# Patient Record
Sex: Female | Born: 1995 | Race: Black or African American | Hispanic: No | Marital: Single | Smoking: Never smoker
Health system: Southern US, Community
[De-identification: ages and names within clinical notes are randomized; demographics above are authoritative.]

## PROBLEM LIST (undated history)

## (undated) HISTORY — PX: TYMPANOSTOMY TUBE PLACEMENT: SHX32

---
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
## 2016-10-08 ENCOUNTER — Encounter (HOSPITAL_COMMUNITY): Payer: Self-pay | Admitting: Emergency Medicine

## 2016-10-08 ENCOUNTER — Ambulatory Visit (HOSPITAL_COMMUNITY)
Admission: EM | Admit: 2016-10-08 | Discharge: 2016-10-08 | Disposition: A | Discharge status: Home / Self Care | Payer: BLUE CROSS/BLUE SHIELD | Attending: Emergency Medicine | Admitting: Emergency Medicine

## 2016-10-08 DIAGNOSIS — R0789 Other chest pain: Secondary | ICD-10-CM

## 2016-10-08 DIAGNOSIS — M940 Chondrocostal junction syndrome [Tietze]: Secondary | ICD-10-CM

## 2016-10-08 MED ORDER — NAPROXEN 375 MG PO TABS: 375.0000 mg | ORAL_TABLET | Freq: Two times a day (BID) | ORAL | 0 refills | Status: DC

## 2016-10-08 NOTE — Discharge Instructions (Signed)
Apply ice to the chest wall wherever you have pain. Do this to 3 times a day for the next few days. Also take the prescribed anti-inflammatory medicine as directed. Take with food. Realized that this type of pain often lasts for several days to several weeks. No indication of heart or lung problems.

## 2016-10-08 NOTE — ED Provider Notes (Signed)
CSN: [id number]     Arrival date & time 10/08/16  1201 History   First MD Initiated Contact with Patient 10/08/16 1359     Chief Complaint  Patient presents with  . Chest Pain  . Cough   (Consider location/radiation/quality/duration/timing/severity/associated sxs/prior Treatment) 21 year old female complaining of pain in the chest that hurts when she takes a deep breath and touches her ribs. Started suddenly around 1 AM this morning. Denies shortness of breath or cough or other symptoms.      History reviewed. No pertinent past medical history. Past Surgical History:  Procedure Laterality Date  . TYMPANOSTOMY TUBE PLACEMENT     History reviewed. No pertinent family history. Social History  Substance Use Topics  . Smoking status: Never Smoker  . Smokeless tobacco: Not on file  . Alcohol use No   OB History    No data available     Review of Systems  Constitutional: Negative.   Respiratory: Negative.  Negative for shortness of breath and wheezing.   Cardiovascular: Positive for chest pain. Negative for palpitations and leg swelling.  Gastrointestinal: Negative.   Skin: Negative.   Neurological: Negative.   All other systems reviewed and are negative.   Allergies  Patient has no known allergies.  Home Medications   Prior to Admission medications   Medication Sig Start Date End Date Taking? Authorizing Provider  naproxen (NAPROSYN) 375 MG tablet Take 1 tablet (375 mg total) by mouth 2 (two) times daily. 10/08/16   Janne Napoleon, NP   Meds Ordered and Administered this Visit  Medications - No data to display  BP 105/71 (BP Location: Right Arm)   Pulse 67   Temp 98.5 F (36.9 C) (Oral)   Resp 18   LMP 10/01/2016   SpO2 100%  No data found.   Physical Exam  Constitutional: She is oriented to person, place, and time. She appears well-developed and well-nourished.  Neck: Neck supple.  Cardiovascular: Normal rate, regular rhythm, normal heart sounds and intact  distal pulses.   Pulmonary/Chest: Effort normal and breath sounds normal. No respiratory distress. She has no wheezes. She has no rales. She exhibits tenderness.  Marked, reproducible chest wall tenderness to the right upper chest and along the parasternal borders and bilateral costal margins. Having patient take a deep breath causes her to stop suddenly toward the end of the breath because of pain in the areas described above.  Musculoskeletal: Normal range of motion. She exhibits no edema or deformity.  Neurological: She is alert and oriented to person, place, and time.  Skin: Skin is warm and dry.  Psychiatric: She has a normal mood and affect.  Nursing note and vitals reviewed.   Urgent Care Course     Procedures (including critical care time)  Labs Review Labs Reviewed - No data to display  Imaging Review No results found.   Visual Acuity Review  Right Eye Distance:   Left Eye Distance:   Bilateral Distance:    Right Eye Near:   Left Eye Near:    Bilateral Near:         MDM   1. Chest wall pain   2. Costochondritis, acute    Apply ice to the chest wall wherever you have pain. Do this to 3 times a day for the next few days. Also take the prescribed anti-inflammatory medicine as directed. Take with food. Realized that this type of pain often lasts for several days to several weeks. No indication of heart or lung  problems. Meds ordered this encounter  Medications  . naproxen (NAPROSYN) 375 MG tablet    Sig: Take 1 tablet (375 mg total) by mouth 2 (two) times daily.    Dispense:  20 tablet    Refill:  0    Order Specific Question:   Supervising Provider    Answer:   Melynda Ripple [[id number]]       Janne Napoleon, NP 10/08/16 1419

## 2016-10-08 NOTE — ED Triage Notes (Signed)
See s/s

## 2017-12-11 ENCOUNTER — Other Ambulatory Visit: Payer: Self-pay | Admitting: Family Medicine

## 2017-12-11 ENCOUNTER — Ambulatory Visit
Admission: RE | Admit: 2017-12-11 | Discharge: 2017-12-11 | Disposition: A | Discharge status: Home / Self Care | Source: Ambulatory Visit | Attending: Family Medicine | Admitting: Family Medicine

## 2017-12-11 DIAGNOSIS — M545 Low back pain: Secondary | ICD-10-CM

## 2018-01-01 ENCOUNTER — Other Ambulatory Visit: Payer: Self-pay | Admitting: Family Medicine

## 2018-01-01 ENCOUNTER — Other Ambulatory Visit

## 2018-01-01 ENCOUNTER — Ambulatory Visit
Admission: RE | Admit: 2018-01-01 | Discharge: 2018-01-01 | Disposition: A | Discharge status: Home / Self Care | Source: Ambulatory Visit | Attending: Family Medicine | Admitting: Family Medicine

## 2018-01-01 DIAGNOSIS — M545 Low back pain: Secondary | ICD-10-CM

## 2018-01-01 DIAGNOSIS — R52 Pain, unspecified: Secondary | ICD-10-CM

## 2019-04-20 ENCOUNTER — Other Ambulatory Visit: Payer: Self-pay

## 2019-04-20 DIAGNOSIS — Z20822 Contact with and (suspected) exposure to covid-19: Secondary | ICD-10-CM

## 2019-04-22 LAB — NOVEL CORONAVIRUS, NAA: SARS-CoV-2, NAA: NOT DETECTED

## 2020-01-11 ENCOUNTER — Other Ambulatory Visit: Payer: Self-pay | Admitting: Internal Medicine

## 2020-01-11 DIAGNOSIS — N949 Unspecified condition associated with female genital organs and menstrual cycle: Secondary | ICD-10-CM

## 2020-01-20 ENCOUNTER — Ambulatory Visit
Admission: RE | Admit: 2020-01-20 | Discharge: 2020-01-20 | Disposition: A | Discharge status: Home / Self Care | Payer: No Typology Code available for payment source | Source: Ambulatory Visit | Attending: Internal Medicine | Admitting: Internal Medicine

## 2020-01-20 ENCOUNTER — Other Ambulatory Visit: Payer: Self-pay

## 2020-01-20 DIAGNOSIS — N949 Unspecified condition associated with female genital organs and menstrual cycle: Secondary | ICD-10-CM | POA: Diagnosis not present

## 2020-01-26 ENCOUNTER — Ambulatory Visit (INDEPENDENT_AMBULATORY_CARE_PROVIDER_SITE_OTHER): Payer: No Typology Code available for payment source | Admitting: Podiatry

## 2020-01-26 ENCOUNTER — Ambulatory Visit (INDEPENDENT_AMBULATORY_CARE_PROVIDER_SITE_OTHER): Payer: No Typology Code available for payment source

## 2020-01-26 ENCOUNTER — Encounter: Payer: Self-pay | Admitting: Podiatry

## 2020-01-26 ENCOUNTER — Other Ambulatory Visit: Payer: Self-pay

## 2020-01-26 DIAGNOSIS — M2141 Flat foot [pes planus] (acquired), right foot: Secondary | ICD-10-CM

## 2020-01-26 DIAGNOSIS — M2142 Flat foot [pes planus] (acquired), left foot: Secondary | ICD-10-CM | POA: Diagnosis not present

## 2020-01-26 NOTE — Progress Notes (Signed)
   Subjective:  24 y.o. female presenting today as a new patient who recently got out of the [employer] after serving for 6 years presenting today for evaluation of bilateral foot pain.  Patient has constant ache and pain with activity.  She is very limited to her activities lately.  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  She has been dealing with severe foot pain over the past 1-2 years.  She notices cracking and popping with walking and bending over.  She has been soaking her feet in doing foot exercises with foot massage with minimal relief.  She denies history of injury   No past medical history on file.     Objective/Physical Exam General: The patient is alert and oriented x3 in no acute distress.  Dermatology: Skin is warm, dry and supple bilateral lower extremities. Negative for open lesions or macerations.  Vascular: Palpable pedal pulses bilaterally. No edema or erythema noted. Capillary refill within normal limits.  Neurological: Epicritic and protective threshold grossly intact bilaterally.   Musculoskeletal Exam: Range of motion within normal limits to all pedal and ankle joints bilateral. Muscle strength 5/5 in all groups bilateral.  Upon weightbearing there is a medial longitudinal arch collapse bilaterally. Remove foot valgus noted to the bilateral lower extremities with excessive pronation upon mid stance.  Radiographic Exam:  Normal osseous mineralization. Joint spaces preserved. No fracture/dislocation/boney destruction.   Pes planus noted on radiographic exam lateral views. Decreased calcaneal inclination and metatarsal declination angle is noted. Anterior break in the cyma line noted on lateral views. Medial talar head to deviation noted on AP radiograph.   Assessment: 1. pes planus bilateral   Plan of Care:  1. Patient was evaluated. X-Rays reviewed.  2.  Prescription provided today for custom  molded orthotics to take to the [place] for approval 3.  Recommend good supportive shoes at [facility] in [place] 4.  Recommend daily foot exercises to strengthen the intrinsic musculature of the foot 5.  Return to clinic as needed   Edrick Kins, DPM [facility]  Dr. Edrick Kins, [address]                                        [place], [place] [address]                Office [phone]  Fax [phone]

## 2020-02-08 ENCOUNTER — Telehealth: Payer: Self-pay | Admitting: Obstetrics & Gynecology

## 2020-02-08 NOTE — Telephone Encounter (Signed)
[facility], contraception has nexplanon. Paper records. Called and left voicemail for patient to call back to be scheduled.

## 2020-02-10 NOTE — Telephone Encounter (Signed)
Called and left voicemail for patient to call back to be scheduled. 

## 2020-02-17 NOTE — Patient Instructions (Signed)
I value your feedback and entrusting us with your care. If you get a [facility] patient survey, I would appreciate you taking the time to let us know about your experience today. Thank you!  As of July 09, 2019, your lab results will be released to your MyChart immediately, before I even have a chance to see them. Please give me time to review them and contact you if there are any abnormalities. Thank you for your patience.  

## 2020-02-17 NOTE — Progress Notes (Signed)
Brittney Ping, MD   Chief Complaint  Patient presents with  . Pelvic Pain    entire pelvic area, dark blood (cycles), weight gain, headaches    HPI:      [name] is a 24 y.o. No obstetric history on file. whose LMP was Patient's last menstrual period was 01/28/2020 (approximate)., presents today for NP eval of BTB with nexplanon, referred by PCP. Nexplanon placed ~ summer 2019, but pt unsure of date. This was 2nd nexplanon and pt has noted hormonal acne and wt changes with this device. Menses are usually monthly, last 3-5 days, no BTB, mild dysmen, improved with NSAIDs. Has had dark BTB for a few days the past few cycles. Noted to have 2.5 cm simple RTO cyst on 6/21 U/S, 2.8 cm leio. Has questions about them. She is sex active, no pain/bleeding. No recent STD testing. Is current on pap smear last yr. No hx of abn paps/STDs.  No hx of HTN, DVTs, migraines with aura. Did OCPs in past.    History reviewed. No pertinent past medical history.  Past Surgical History:  Procedure Laterality Date  . TYMPANOSTOMY TUBE PLACEMENT      Family History  Problem Relation Age of Onset  . Other Mother        low iron, low blood pressure    Social History   Socioeconomic History  . Marital status: Single    Spouse name: Not on file  . Number of children: Not on file  . Years of education: Not on file  . Highest education level: Not on file  Occupational History  . Not on file  Tobacco Use  . Smoking status: Never Smoker  . Smokeless tobacco: Never Used  Vaping Use  . Vaping Use: Never used  Substance and Sexual Activity  . Alcohol use: No  . Drug use: No  . Sexual activity: Yes    Birth control/protection: None, Implant  Other Topics Concern  . Not on file  Social History Narrative  . Not on file   Social Determinants of Health   Financial Resource Strain:   . Difficulty of Paying Living Expenses:   Food Insecurity:   . Worried About Charity fundraiser  in the Last Year:   . Arboriculturist in the Last Year:   Transportation Needs:   . Film/video editor (Medical):   Marland Kitchen Lack of Transportation (Non-Medical):   Physical Activity:   . Days of Exercise per Week:   . Minutes of Exercise per Session:   Stress:   . Feeling of Stress :   Social Connections:   . Frequency of Communication with Friends and Family:   . Frequency of Social Gatherings with Friends and Family:   . Attends Religious Services:   . Active Member of Clubs or Organizations:   . Attends Archivist Meetings:   Marland Kitchen Marital Status:   Intimate Partner Violence:   . Fear of Current or Ex-Partner:   . Emotionally Abused:   Marland Kitchen Physically Abused:   . Sexually Abused:     Outpatient Medications Prior to Visit  Medication Sig Dispense Refill  . etonogestrel (NEXPLANON) 68 MG IMPL implant 1 each by Subdermal route once. Unsure of insertion date, thinks 2019.    . naproxen (NAPROSYN) 375 MG tablet Take 1 tablet (375 mg total) by mouth 2 (two) times daily. 20 tablet 0   No facility-administered medications prior to visit.  ROS:  Review of Systems  Constitutional: Negative for fever.  Gastrointestinal: Negative for blood in stool, constipation, diarrhea, nausea and vomiting.  Genitourinary: Positive for vaginal bleeding. Negative for dyspareunia, dysuria, flank pain, frequency, hematuria, urgency, vaginal discharge and vaginal pain.  Musculoskeletal: Negative for back pain.  Skin: Negative for rash.   BREAST: No symptoms   OBJECTIVE:   Vitals:  BP 100/70   Ht 5\' 1"  (1.549 m)   Wt 177 lb (80.3 kg)   LMP 01/28/2020 (Approximate)   BMI 33.44 kg/m   Physical Exam Vitals reviewed.  Constitutional:      Appearance: She is well-developed.  Pulmonary:     Effort: Pulmonary effort is normal.  Genitourinary:    General: Normal vulva.     Pubic Area: No rash.      Labia:        Right: No rash, tenderness or lesion.        Left: No rash,  tenderness or lesion.      Vagina: Bleeding present. No vaginal discharge, erythema or tenderness.     Cervix: Normal.     Uterus: Normal. Not enlarged and not tender.      Adnexa: Right adnexa normal and left adnexa normal.       Right: No mass or tenderness.         Left: No mass or tenderness.    Musculoskeletal:        General: Normal range of motion.     Cervical back: Normal range of motion.  Skin:    General: Skin is warm and dry.  Neurological:     General: No focal deficit present.     Mental Status: She is alert and oriented to person, place, and time.  Psychiatric:        Mood and Affect: Mood normal.        Behavior: Behavior normal.        Thought Content: Thought content normal.        Judgment: Judgment normal.     Assessment/Plan: Breakthrough bleeding on Nexplanon--Reassurance. Normal side effect, especially considering neg GYN u/s 6/21. Check STDs. If neg, can follow along. Can add POPs prn for bleeding if sx worsen. Also discussed nexplanon removal and changing to different BC. Pt doesn't want IUD, can't have xulane due to BMI. Pt to consider and f/u prn.   Screening for STD (sexually transmitted disease) - Plan: Cervicovaginal ancillary only  Right ovarian cyst--small, simple cyst. No further imaging needed. Reassurance. F/u prn.   Leiomyoma--small leio. Questions answered.     Return if symptoms worsen or fail to improve.  Brittney Trevino B. Brittney Bransfield, PA-C 02/18/2020 10:36 AM

## 2020-02-18 ENCOUNTER — Ambulatory Visit (INDEPENDENT_AMBULATORY_CARE_PROVIDER_SITE_OTHER): Payer: No Typology Code available for payment source | Admitting: Obstetrics and Gynecology

## 2020-02-18 ENCOUNTER — Encounter: Payer: Self-pay | Admitting: Obstetrics and Gynecology

## 2020-02-18 ENCOUNTER — Other Ambulatory Visit: Payer: Self-pay

## 2020-02-18 ENCOUNTER — Other Ambulatory Visit (HOSPITAL_COMMUNITY)
Admission: RE | Admit: 2020-02-18 | Discharge: 2020-02-18 | Disposition: A | Discharge status: Home / Self Care | Payer: No Typology Code available for payment source | Source: Ambulatory Visit | Attending: Obstetrics and Gynecology | Admitting: Obstetrics and Gynecology

## 2020-02-18 VITALS — BP 100/70 | Ht 61.0 in | Wt 177.0 lb

## 2020-02-18 DIAGNOSIS — Z975 Presence of (intrauterine) contraceptive device: Secondary | ICD-10-CM

## 2020-02-18 DIAGNOSIS — Z113 Encounter for screening for infections with a predominantly sexual mode of transmission: Secondary | ICD-10-CM

## 2020-02-18 DIAGNOSIS — D219 Benign neoplasm of connective and other soft tissue, unspecified: Secondary | ICD-10-CM | POA: Diagnosis not present

## 2020-02-18 DIAGNOSIS — N83201 Unspecified ovarian cyst, right side: Secondary | ICD-10-CM | POA: Insufficient documentation

## 2020-02-18 DIAGNOSIS — N921 Excessive and frequent menstruation with irregular cycle: Secondary | ICD-10-CM

## 2020-02-19 LAB — CERVICOVAGINAL ANCILLARY ONLY
Chlamydia: NEGATIVE
Comment: NEGATIVE
Comment: NORMAL
Neisseria Gonorrhea: NEGATIVE

## 2020-11-14 IMAGING — US US PELVIS COMPLETE WITH TRANSVAGINAL
1 series · 14 of 25 positions shown · non-contrast
Comparison: None

CLINICAL DATA: Pelvic pressure adnexal cyst

EXAM:
TRANSABDOMINAL AND TRANSVAGINAL ULTRASOUND OF PELVIS
TECHNIQUE: Both transabdominal and transvaginal ultrasound examinations of the
pelvis were performed. Transabdominal technique was performed for
global imaging of the pelvis including uterus, ovaries, adnexal
regions, and pelvic cul-de-sac. It was necessary to proceed with
endovaginal exam following the transabdominal exam to visualize the
uterus endometrium ovaries.

[Series 1: us pelvis complete with transvaginal · 0.25mm/px · 108 acquisitions, 14 frames shown]
[im 1/108]
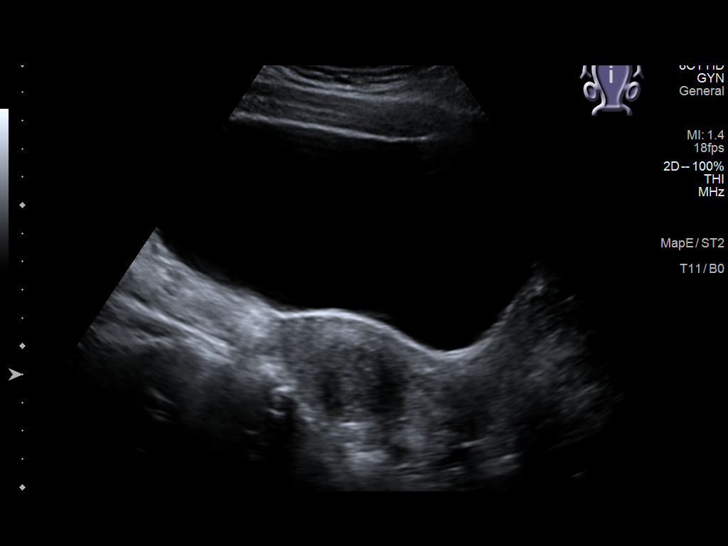
[im 9/108]
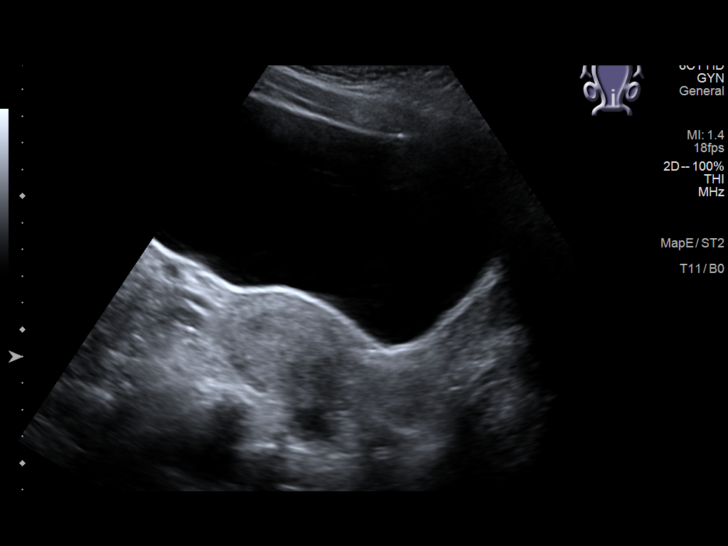
[im 18/108]
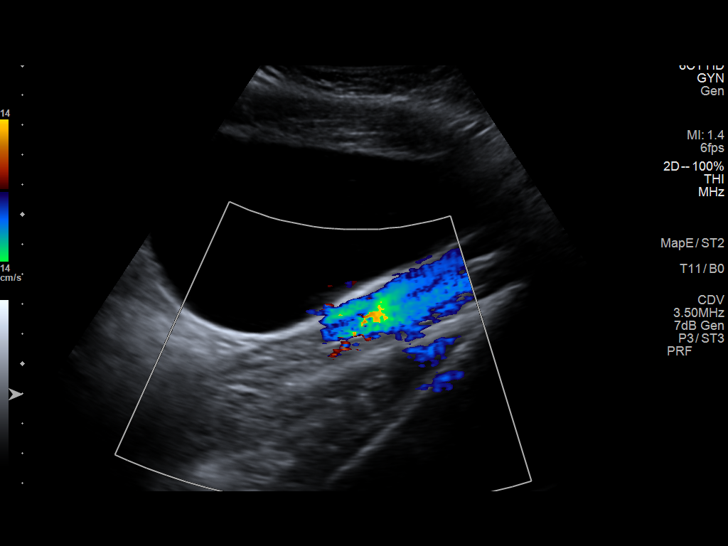
[im 27/108]
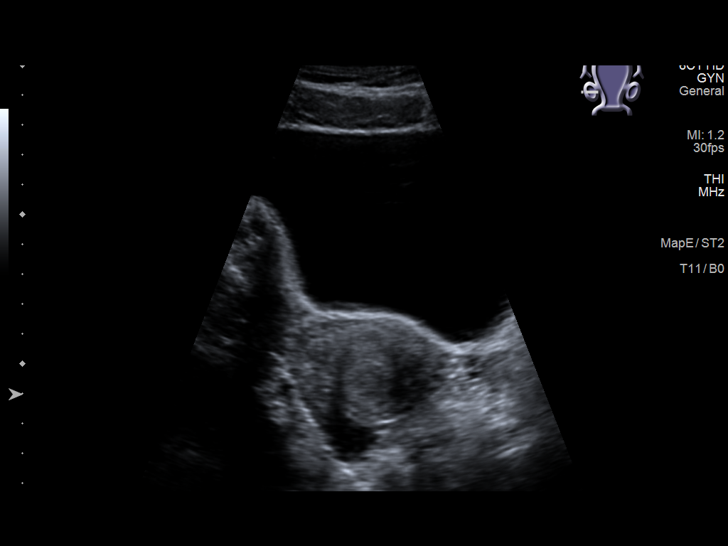
[im 36/108]
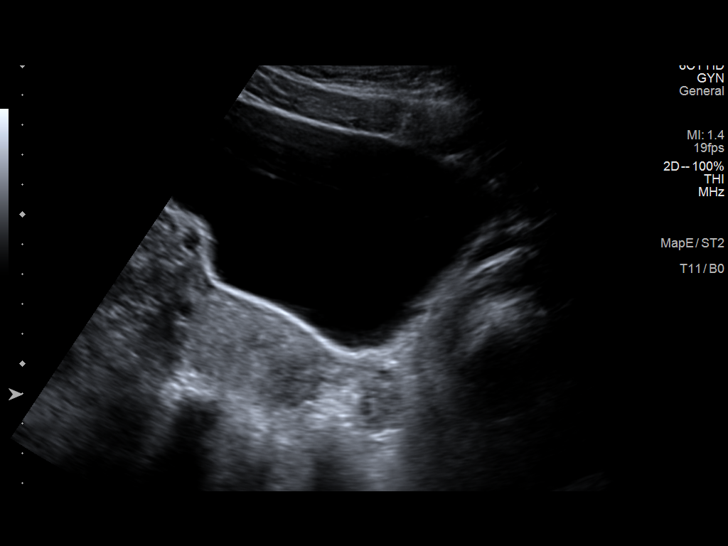
[im 41/108]
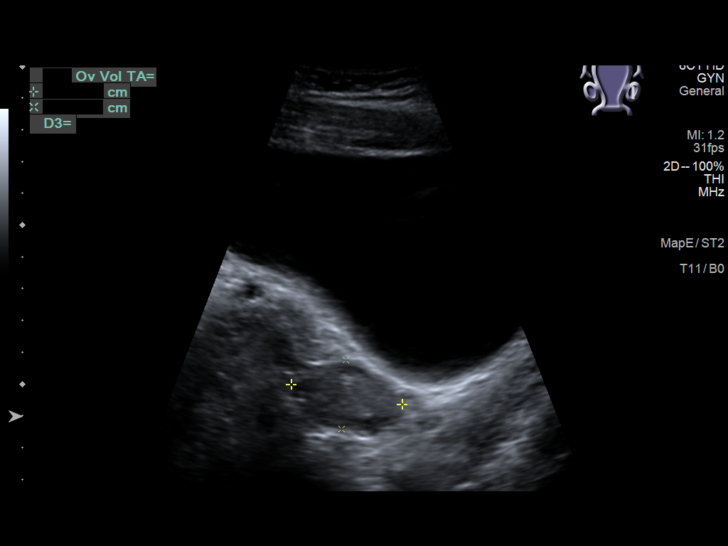
[im 50/108]
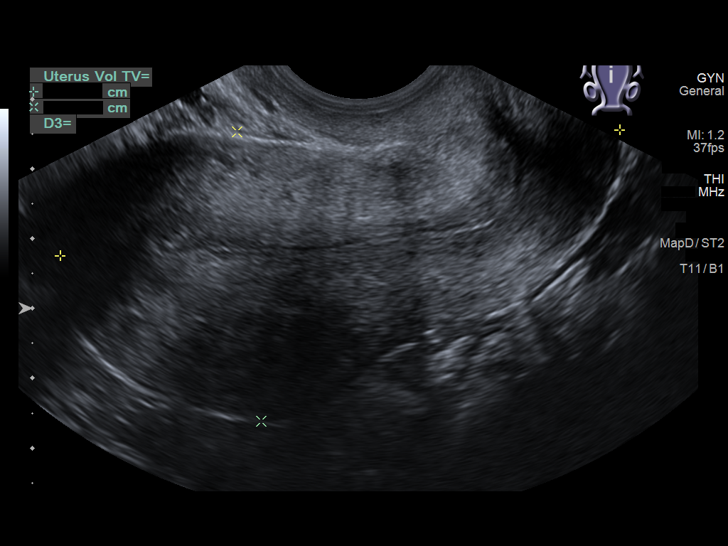
[im 58/108]
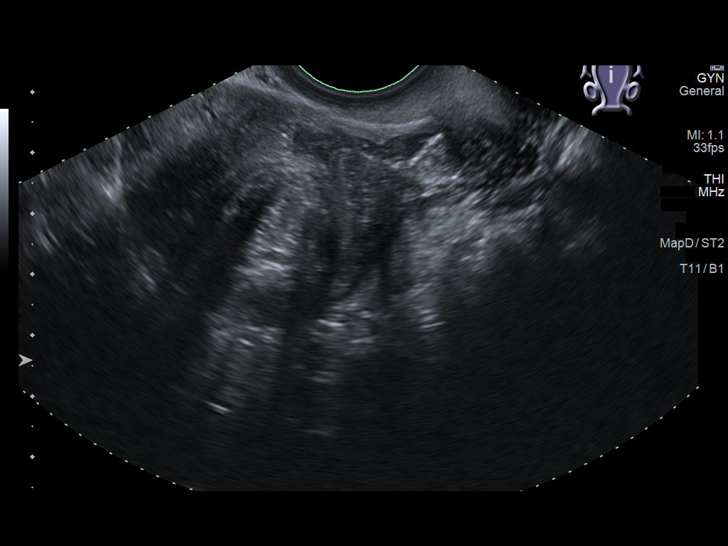
[im 67/108]
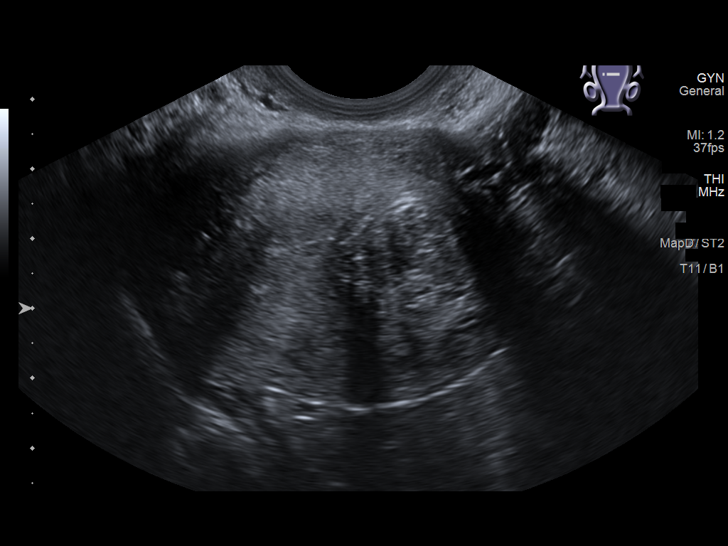
[im 72/108]
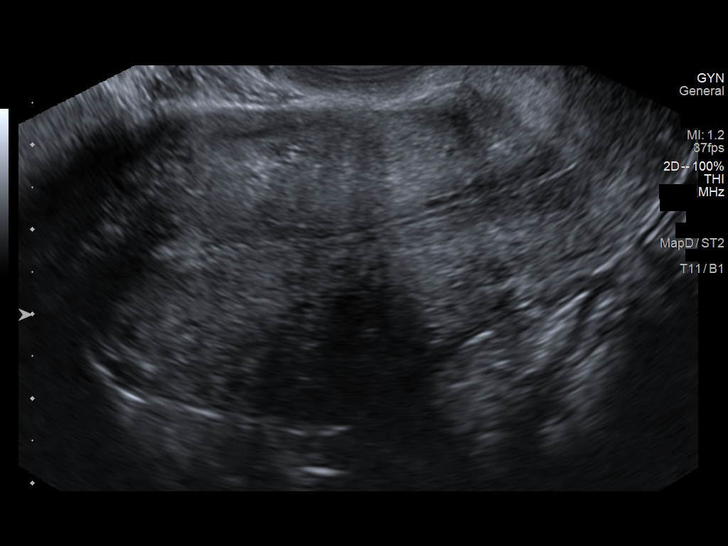
[im 81/108]
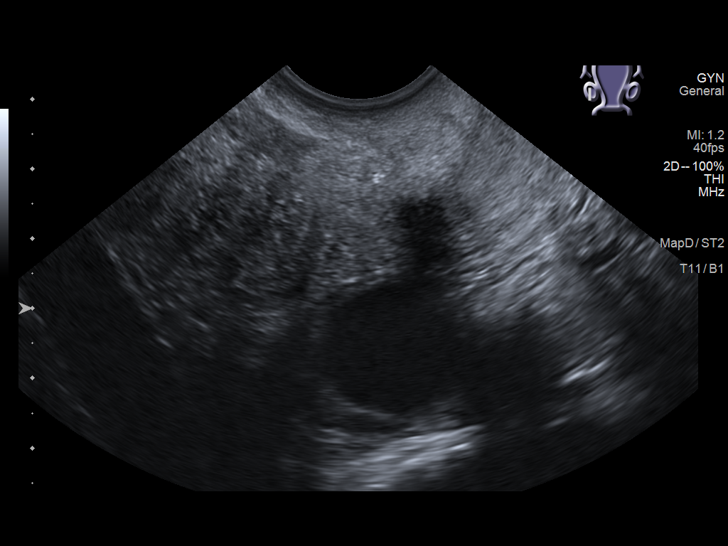
[im 90/108]
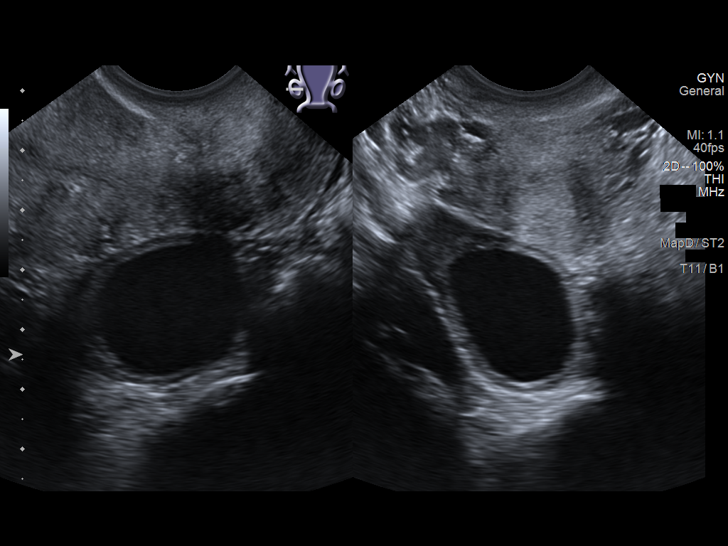
[im 99/108]
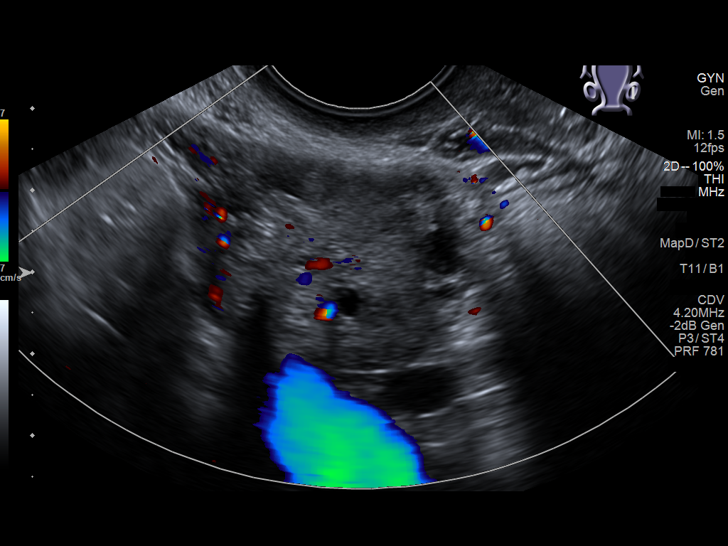
[im 108/108]
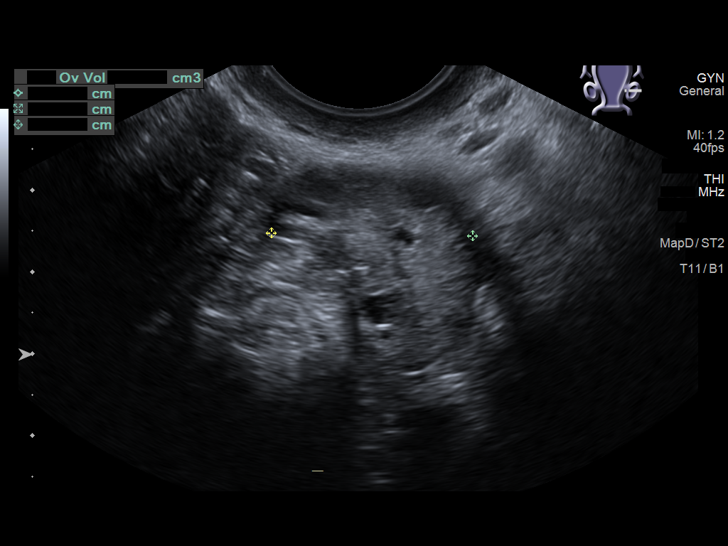

[14 of 25 positions shown; findings below may reference images not displayed]

FINDINGS: Uterus

Measurements: 8.4 x 4.2 x 5.3 cm = volume: 95.5 mL. Left posterior
submucosal mass measuring 2.8 x 2.5 x 2.2 cm consistent with a
fibroid.

Endometrium

Thickness: 7.3 mm.  No focal abnormality visualized.

Right ovary

Measurements: 4 x 2.6 x 2.6 cm = volume: 12.2 mL. Anechoic cyst
measuring 2.5 cm.

Left ovary

Measurements: 3 x 2.6 x 2.5 cm = volume: 10.1 mL. Normal
appearance/no adnexal mass.

Other findings

No abnormal free fluid.
IMPRESSION: 1. Uterine fibroid.
2. 2.5 cm simple cyst in the right ovary.
